# Patient Record
Sex: Male | Born: 1940 | Race: White | Hispanic: No | Marital: Married | Smoking: Never smoker
Health system: Southern US, Community
[De-identification: ages and names within clinical notes are randomized; demographics above are authoritative.]

## PROBLEM LIST (undated history)

## (undated) DIAGNOSIS — I10 Essential (primary) hypertension: Secondary | ICD-10-CM

## (undated) HISTORY — PX: TONSILLECTOMY: SUR1361

---

## 2018-12-24 HISTORY — PX: PACEMAKER PLACEMENT: SHX43

## 2021-06-14 ENCOUNTER — Other Ambulatory Visit: Payer: Self-pay

## 2021-06-14 ENCOUNTER — Emergency Department: Payer: Medicare Other

## 2021-06-14 ENCOUNTER — Emergency Department
Admission: EM | Admit: 2021-06-14 | Discharge: 2021-06-14 | Disposition: A | Discharge status: Home / Self Care | Payer: Medicare Other | Attending: Emergency Medicine | Admitting: Emergency Medicine

## 2021-06-14 DIAGNOSIS — R55 Syncope and collapse: Secondary | ICD-10-CM | POA: Diagnosis not present

## 2021-06-14 DIAGNOSIS — I10 Essential (primary) hypertension: Secondary | ICD-10-CM | POA: Insufficient documentation

## 2021-06-14 DIAGNOSIS — R112 Nausea with vomiting, unspecified: Secondary | ICD-10-CM

## 2021-06-14 DIAGNOSIS — R42 Dizziness and giddiness: Secondary | ICD-10-CM | POA: Diagnosis not present

## 2021-06-14 DIAGNOSIS — Z950 Presence of cardiac pacemaker: Secondary | ICD-10-CM | POA: Insufficient documentation

## 2021-06-14 HISTORY — DX: Essential (primary) hypertension: I10

## 2021-06-14 LAB — URINALYSIS, COMPLETE (UACMP) WITH MICROSCOPIC
Bacteria, UA: NONE SEEN
Bilirubin Urine: NEGATIVE
Glucose, UA: NEGATIVE mg/dL
Hgb urine dipstick: NEGATIVE
Ketones, ur: NEGATIVE mg/dL
Nitrite: NEGATIVE
Protein, ur: NEGATIVE mg/dL
Specific Gravity, Urine: 1.02 (ref 1.005–1.030)
pH: 5 (ref 5.0–8.0)

## 2021-06-14 LAB — BASIC METABOLIC PANEL
Anion gap: 8 (ref 5–15)
BUN: 18 mg/dL (ref 8–23)
CO2: 23 mmol/L (ref 22–32)
Calcium: 9.1 mg/dL (ref 8.9–10.3)
Chloride: 108 mmol/L (ref 98–111)
Creatinine, Ser: 1.03 mg/dL (ref 0.61–1.24)
GFR, Estimated: >60 mL/min (ref >60)
Glucose, Bld: 169 mg/dL — ABNORMAL HIGH (ref 70–99)
Potassium: 3.6 mmol/L (ref 3.5–5.1)
Sodium: 139 mmol/L (ref 135–145)

## 2021-06-14 LAB — CBC
HCT: 37.1 % — ABNORMAL LOW (ref 39.0–52.0)
Hemoglobin: 12.6 g/dL — ABNORMAL LOW (ref 13.0–17.0)
MCH: 28.9 pg (ref 26.0–34.0)
MCHC: 34 g/dL (ref 30.0–36.0)
MCV: 85.1 fL (ref 80.0–100.0)
Platelets: 309 10*3/uL (ref 150–400)
RBC: 4.36 MIL/uL (ref 4.22–5.81)
RDW: 15 % (ref 11.5–15.5)
WBC: 8.5 10*3/uL (ref 4.0–10.5)
nRBC: 0 % (ref 0.0–0.2)

## 2021-06-14 LAB — TROPONIN I (HIGH SENSITIVITY)
Troponin I (High Sensitivity): 6 ng/L (ref <18)
Troponin I (High Sensitivity): 6 ng/L (ref <18)

## 2021-06-14 MED ORDER — SODIUM CHLORIDE 0.9 % IV SOLN
12.5000 mg | Freq: Four times a day (QID) | INTRAVENOUS | Status: DC | PRN
  Administered 2021-06-14: 12.5 mg via INTRAVENOUS
  Filled 2021-06-14: qty 0.5

## 2021-06-14 MED ORDER — SODIUM CHLORIDE 0.9 % IV BOLUS
1000.0000 mL | Freq: Once | INTRAVENOUS | Status: AC
  Administered 2021-06-14: 1000 mL via INTRAVENOUS

## 2021-06-14 MED ORDER — SODIUM CHLORIDE 0.9 % IV SOLN
12.5000 mg | Freq: Once | INTRAVENOUS | Status: DC
  Filled 2021-06-14: qty 0.5

## 2021-06-14 NOTE — ED Notes (Signed)
Spoke with Lab regarding added trop

## 2021-06-14 NOTE — ED Notes (Signed)
ED Provider at bedside. 

## 2021-06-14 NOTE — ED Triage Notes (Signed)
Pt comes into the ED via [facility] from work c/o sudden onset of dizziness and N/V.  Pt has had 5 episodes of emesis.  Cardiac h/o with pacemaker placed.  4mg  zofran with no success.  20g R forearm. CBG 112. No unilateral weakness.  Pt neurologically intact.

## 2021-06-14 NOTE — ED Notes (Signed)
PT transported to CT>

## 2021-06-14 NOTE — ED Notes (Signed)
Pt calm , collective , denied pain or sob  

## 2021-06-14 NOTE — ED Provider Notes (Signed)
[facility] Emergency Department Provider Note   ____________________________________________   Event Date/Time   First MD Initiated Contact with Patient 06/14/21 1113     (approximate)  I have reviewed the triage vital signs and the nursing notes.   HISTORY  Chief Complaint Dizziness and Emesis    HPI Kevin Meadows is a 80 y.o. male with past medical history of hypertension and bradycardia status post pacemaker who presents to the ED complaining of dizziness.  Patient reports that he is currently visiting the area for work and primarily resides in [place].  He states he was going to visit a client today when he suddenly started feeling dizzy, lightheaded, and nauseous.  He has had multiple episodes of vomiting but denies any associated abdominal pain, flank pain, diarrhea, or dysuria.  He was given Zofran but continues to feel nauseous, denies any associated headache, numbness, or weakness.  He denies any sensation of the room spinning around him.  He has never had similar symptoms in the past and denies any history of vertigo.  He has not had any chest pain or shortness of breath with this episode, also denies palpitations.        Past Medical History:  Diagnosis Date   Hypertension     There are no problems to display for this patient.   Past Surgical History:  Procedure Laterality Date   PACEMAKER PLACEMENT  2020   TONSILLECTOMY      Prior to Admission medications   Not on File    Allergies Penicillins  History reviewed. No pertinent family history.  Social History Social History   Tobacco Use   Smoking status: Never    Passive exposure: Never   Smokeless tobacco: Never  Substance Use Topics   Alcohol use: Not Currently    Alcohol/week: 1.0 standard drink    Types: 1 Glasses of wine per week    Review of Systems  Constitutional: No fever/chills Eyes: No visual changes. ENT: No sore throat. Cardiovascular: Denies  chest pain.  Positive for lightheadedness and near syncope. Respiratory: Denies shortness of breath. Gastrointestinal: No abdominal pain.  Positive for nausea and vomiting.  No diarrhea.  No constipation. Genitourinary: Negative for dysuria. Musculoskeletal: Negative for back pain. Skin: Negative for rash. Neurological: Negative for headaches, focal weakness or numbness.  ____________________________________________   PHYSICAL EXAM:  VITAL SIGNS: ED Triage Vitals  Enc Vitals Group     BP 06/14/21 1009 139/74     Pulse Rate 06/14/21 1009 70     Resp 06/14/21 1009 18     Temp 06/14/21 1009 97.6 F (36.4 C)     Temp Source 06/14/21 1009 Oral     SpO2 06/14/21 1009 96 %     Weight 06/14/21 1010 225 lb (102.1 kg)     Height 06/14/21 1010 6' (1.829 m)     Head Circumference --      Peak Flow --      Pain Score 06/14/21 1010 0     Pain Loc --      Pain Edu? --      Excl. in GC? --     Constitutional: Alert and oriented. Eyes: Conjunctivae are normal. Head: Atraumatic. Nose: No congestion/rhinnorhea. Mouth/Throat: Mucous membranes are moist. Neck: Normal ROM Cardiovascular: Normal rate, regular rhythm. Grossly normal heart sounds.  2+ radial pulses bilaterally. Respiratory: Normal respiratory effort.  No retractions. Lungs CTAB. Gastrointestinal: Soft and nontender. No distention. Genitourinary: deferred Musculoskeletal: No lower extremity tenderness nor  edema. Neurologic:  Normal speech and language. No gross focal neurologic deficits are appreciated. Skin:  Skin is warm, dry and intact. No rash noted. Psychiatric: Mood and affect are normal. Speech and behavior are normal.  ____________________________________________   LABS (all labs ordered are listed, but only abnormal results are displayed)  Labs Reviewed  BASIC METABOLIC PANEL - Abnormal; Notable for the following components:      Result Value   Glucose, Bld 169 (*)    All other components within normal limits   CBC - Abnormal; Notable for the following components:   Hemoglobin 12.6 (*)    HCT 37.1 (*)    All other components within normal limits  URINALYSIS, COMPLETE (UACMP) WITH MICROSCOPIC - Abnormal; Notable for the following components:   Color, Urine YELLOW (*)    APPearance CLEAR (*)    Leukocytes,Ua TRACE (*)    All other components within normal limits  CBG MONITORING, ED  TROPONIN I (HIGH SENSITIVITY)  TROPONIN I (HIGH SENSITIVITY)   ____________________________________________  EKG  ED ECG REPORT I, Chesley Noon, the attending physician, personally viewed and interpreted this ECG.   Date: 06/14/2021  EKG Time: 10:13  Rate: 74  Rhythm: AV dual paced rhythm  Axis: Normal  Intervals:none  ST&T Change: None   PROCEDURES  Procedure(s) performed (including Critical Care):  Procedures   ____________________________________________   INITIAL IMPRESSION / ASSESSMENT AND PLAN / ED COURSE      80 year old male with past medical history of hypertension and bradycardia status post pacemaker who presents to the ED complaining of sudden onset lightheadedness, near syncope, nausea, and vomiting.  Patient denies any chest pain or shortness of breath, EKG shows dual-chamber paced rhythm with no ischemic changes.  We will interrogate his pacemaker for more information and observe on cardiac monitor.  Initial labs are reassuring, we will add on troponin.  Symptoms sound less consistent with a peripheral vertigo but given sudden onset we will check CT head.  We will hydrate with IV fluids and treat with Phenergan.  Pacemaker interrogation is reassuring, no recent arrhythmias noted.  Troponin within normal limits and patient remains in normal sinus rhythm.  He reports feeling much better following IV fluids and Phenergan, no further nausea or vomiting here in the ED.  CT head is negative for acute process and patient's dizziness has resolved, no reason to suspect stroke at this time.   Repeat troponin is also within normal limits and patient is appropriate for discharge home with PCP follow-up.  He was counseled to return to the ED for new worsening symptoms, patient agrees with plan.      ____________________________________________   FINAL CLINICAL IMPRESSION(S) / ED DIAGNOSES  Final diagnoses:  Lightheadedness  Near syncope  Non-intractable vomiting with nausea, unspecified vomiting type     ED Discharge Orders     None        Note:  This document was prepared using Dragon voice recognition software and may include unintentional dictation errors.    Chesley Noon, MD 06/14/21 1426

## 2022-03-09 IMAGING — CT CT HEAD W/O CM
4 series · 16 of 47 positions shown, 18 images · non-contrast
Comparison: None

CLINICAL DATA: Sudden onset of dizziness, nausea, and vomiting at
work, 5 episodes of vomiting, cardiac history with pacemaker,
neurologically intact

EXAM:
CT HEAD WITHOUT CONTRAST
TECHNIQUE: Contiguous axial images were obtained from the base of the skull
through the vertex without intravenous contrast. Sagittal and
coronal MPR images reconstructed from axial data set.

[Series 2: head bone · axial · 0.47mm/px · z∈[+316,+348]mm · 3 of 78 slices shown]
[im 8/78  bone]
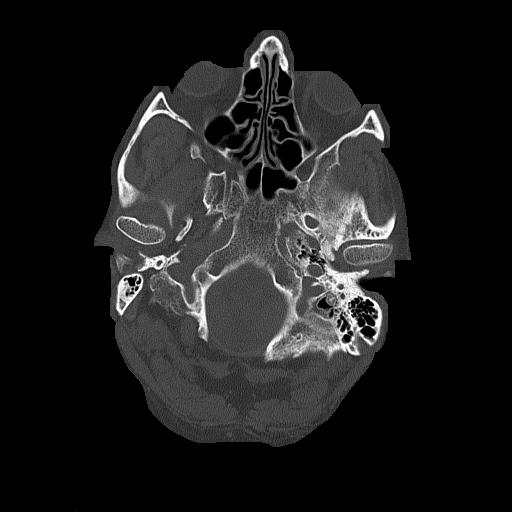
[im 16/78  bone]
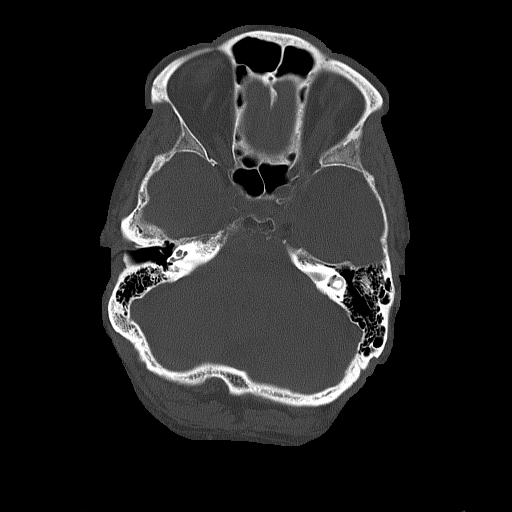
[im 24/78  bone]
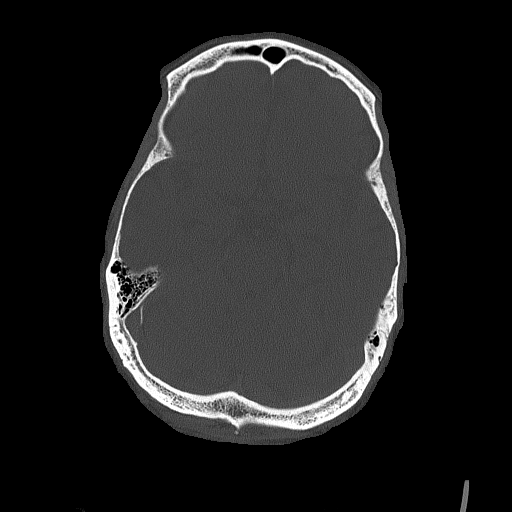

[Series 3: head wo · axial · 0.47mm/px · z∈[+318,+433]mm · 7 of 31 slices shown, 9 images]
[im 4/31  brain]
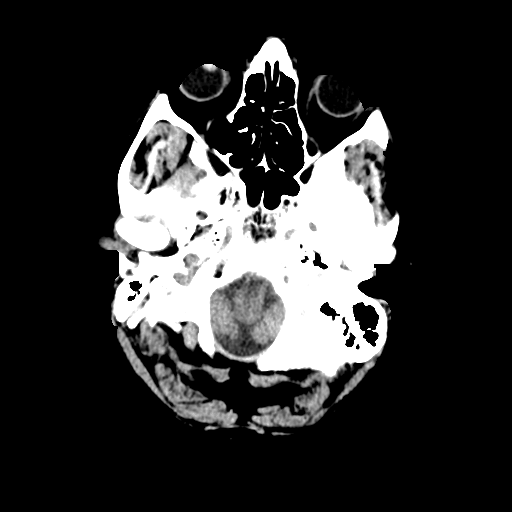
[im 4/31  bone]
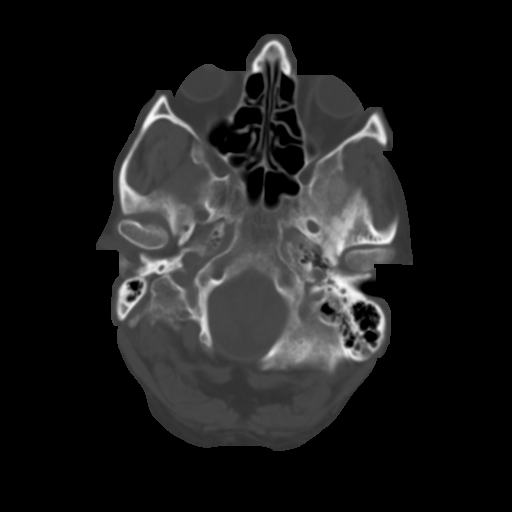
[im 8/31  brain]
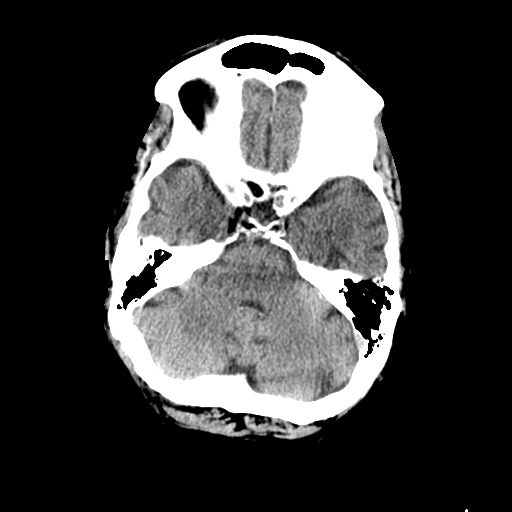
[im 12/31  brain]
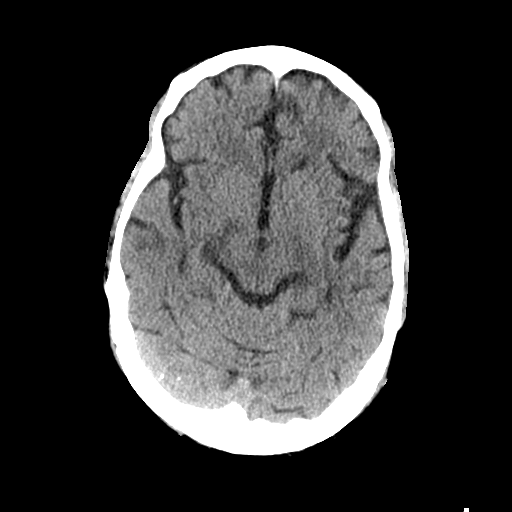
[im 16/31  brain]
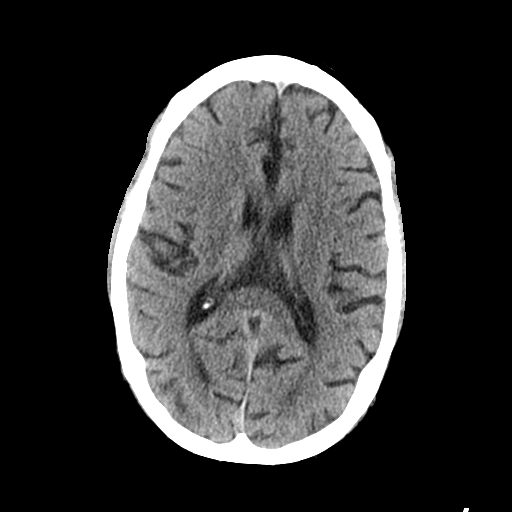
[im 19/31  brain]
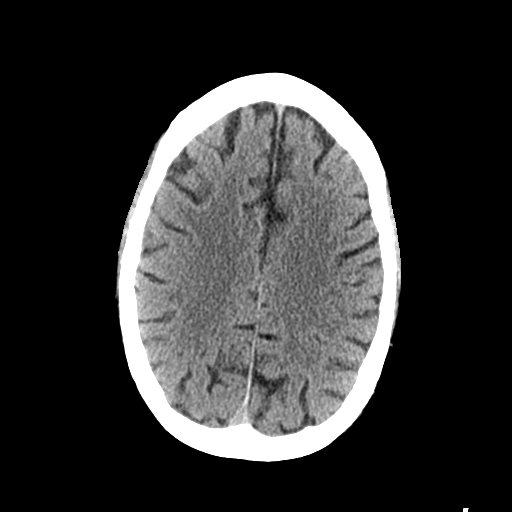
[im 19/31  bone]
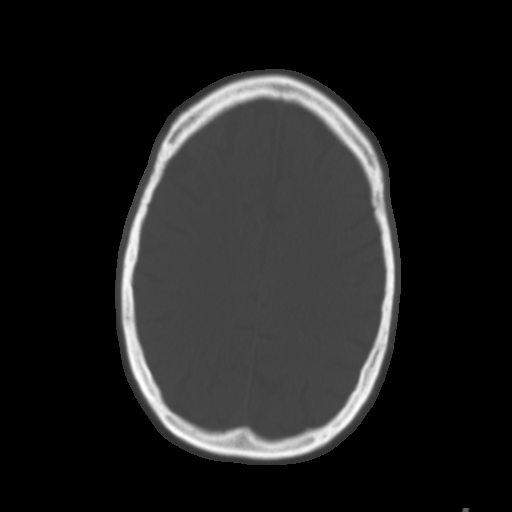
[im 23/31  brain]
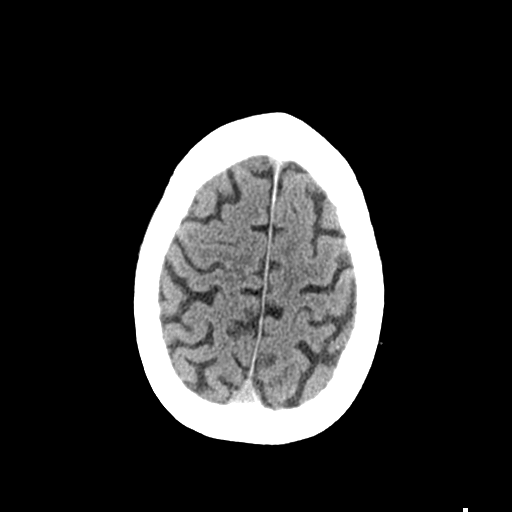
[im 27/31  brain]
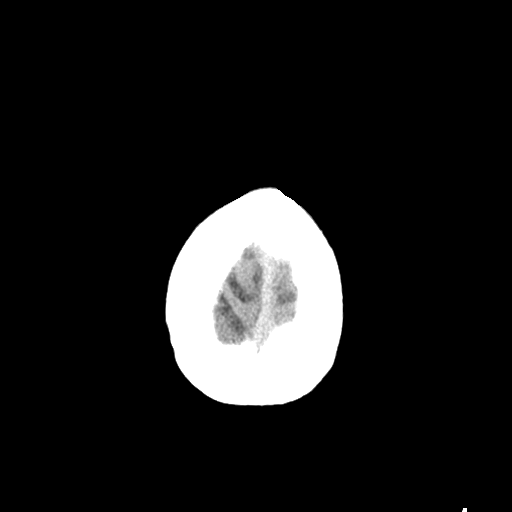

[Series 4: coronal soft tissue · coronal · 0.32mm/px · 3 of 72 slices shown]
[im 24/72  brain]
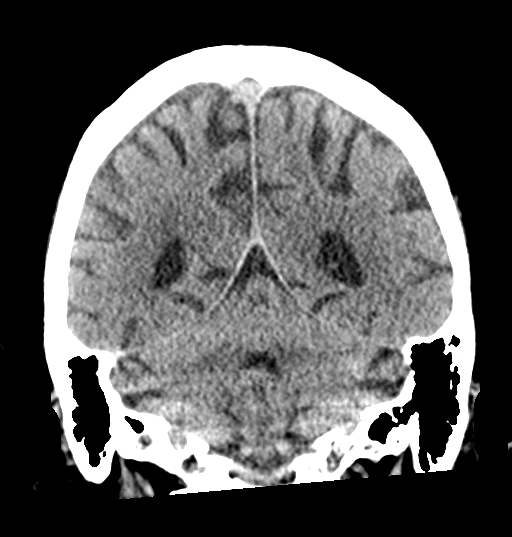
[im 32/72  brain]
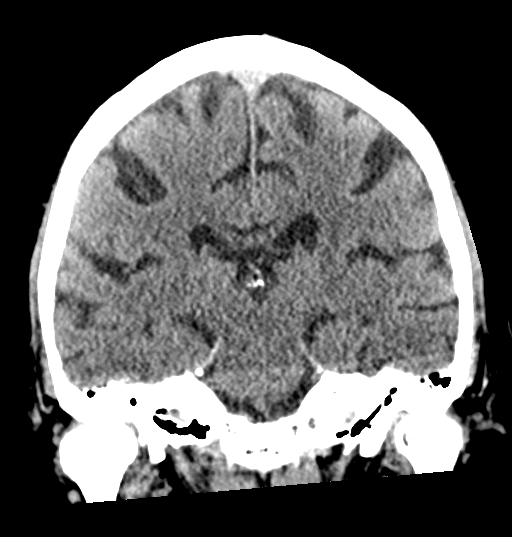
[im 40/72  brain]
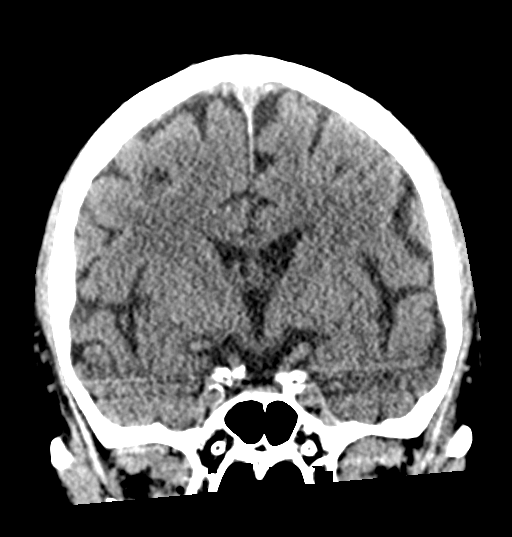

[Series 5: sagittal soft tissue · sagittal · 0.33mm/px · 3 of 55 slices shown]
[im 19/55  brain]
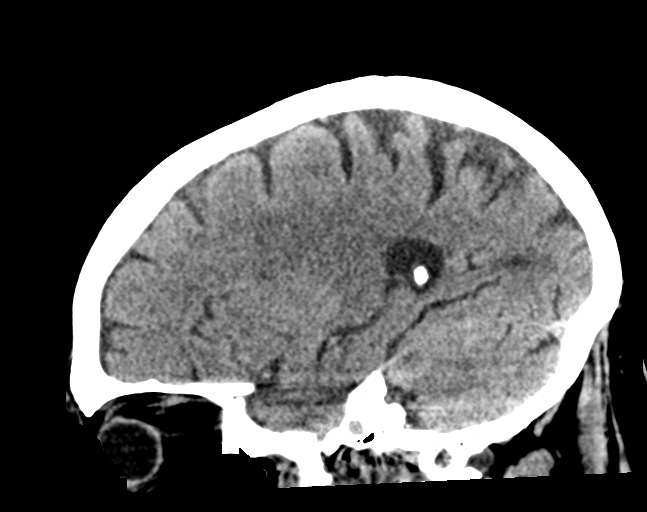
[im 28/55  brain]
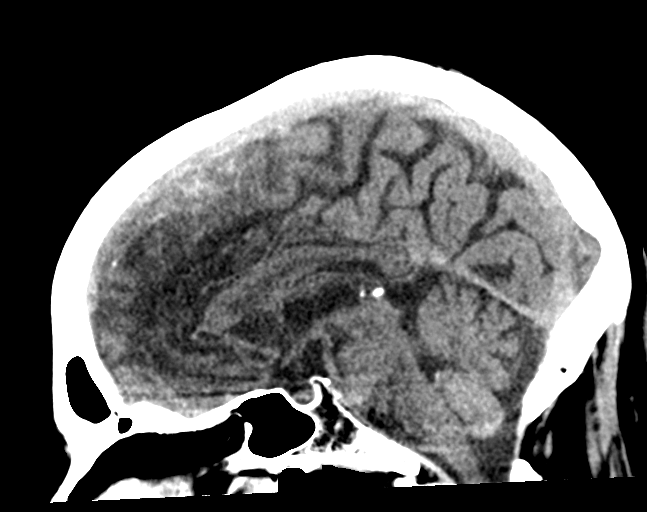
[im 37/55  brain]
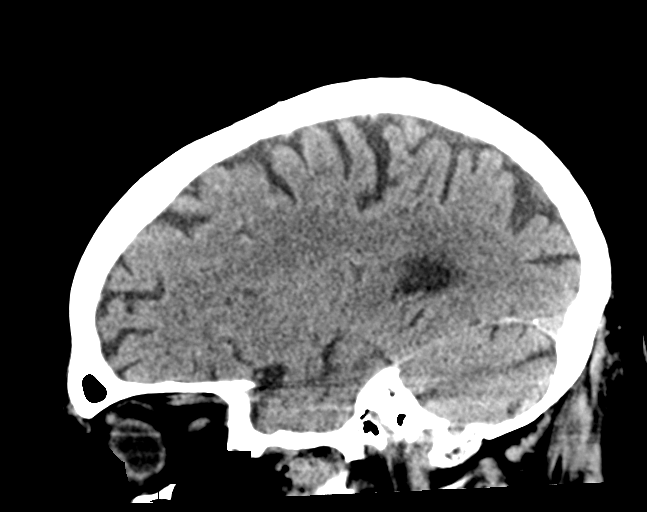

[16 of 47 positions shown; findings below may reference images not displayed]

FINDINGS: Brain: Mild generalized atrophy. Normal ventricular morphology. No
midline shift or mass effect. Otherwise normal appearance of brain
parenchyma. No intracranial hemorrhage, mass lesion, or evidence of
acute infarction. No extra-axial fluid collections.

Vascular: Mild atherosclerotic calcification of internal carotid
arteries at skull base. No hyperdense vessels.

Skull: Intact

Sinuses/Orbits: Clear

Other: N/A
IMPRESSION: Mild generalized atrophy.

No acute intracranial abnormalities.
